# Patient Record
Sex: Female | Born: 1935 | Race: White | Hispanic: No | Marital: Married | Smoking: Never smoker
Health system: Southern US, Community
[De-identification: ages and names within clinical notes are randomized; demographics above are authoritative.]

## PROBLEM LIST (undated history)

## (undated) DIAGNOSIS — I10 Essential (primary) hypertension: Secondary | ICD-10-CM

## (undated) DIAGNOSIS — I4891 Unspecified atrial fibrillation: Secondary | ICD-10-CM

## (undated) HISTORY — PX: PACEMAKER INSERTION: SHX728

---

## 2014-03-04 DEATH — deceased

## 2017-06-17 ENCOUNTER — Emergency Department (HOSPITAL_COMMUNITY): Payer: Medicare Other

## 2017-06-17 ENCOUNTER — Encounter (HOSPITAL_COMMUNITY): Payer: Self-pay | Admitting: *Deleted

## 2017-06-17 ENCOUNTER — Emergency Department (HOSPITAL_COMMUNITY)
Admission: EM | Admit: 2017-06-17 | Discharge: 2017-06-17 | Disposition: A | Discharge status: Home / Self Care | Payer: Medicare Other | Attending: Emergency Medicine | Admitting: Emergency Medicine

## 2017-06-17 DIAGNOSIS — R112 Nausea with vomiting, unspecified: Secondary | ICD-10-CM | POA: Diagnosis present

## 2017-06-17 DIAGNOSIS — D72829 Elevated white blood cell count, unspecified: Secondary | ICD-10-CM | POA: Insufficient documentation

## 2017-06-17 DIAGNOSIS — Z950 Presence of cardiac pacemaker: Secondary | ICD-10-CM | POA: Insufficient documentation

## 2017-06-17 DIAGNOSIS — Z9049 Acquired absence of other specified parts of digestive tract: Secondary | ICD-10-CM | POA: Diagnosis not present

## 2017-06-17 DIAGNOSIS — I10 Essential (primary) hypertension: Secondary | ICD-10-CM | POA: Diagnosis not present

## 2017-06-17 DIAGNOSIS — K5730 Diverticulosis of large intestine without perforation or abscess without bleeding: Secondary | ICD-10-CM | POA: Diagnosis not present

## 2017-06-17 DIAGNOSIS — R197 Diarrhea, unspecified: Secondary | ICD-10-CM | POA: Insufficient documentation

## 2017-06-17 HISTORY — DX: Unspecified atrial fibrillation: I48.91

## 2017-06-17 HISTORY — DX: Essential (primary) hypertension: I10

## 2017-06-17 LAB — CBC
HCT: 41.5 % (ref 36.0–46.0)
HEMOGLOBIN: 13.5 g/dL (ref 12.0–15.0)
MCH: 30.2 pg (ref 26.0–34.0)
MCHC: 32.5 g/dL (ref 30.0–36.0)
MCV: 92.8 fL (ref 78.0–100.0)
Platelets: 346 10*3/uL (ref 150–400)
RBC: 4.47 MIL/uL (ref 3.87–5.11)
RDW: 13 % (ref 11.5–15.5)
WBC: 20.1 10*3/uL — AB (ref 4.0–10.5)

## 2017-06-17 LAB — URINALYSIS, ROUTINE W REFLEX MICROSCOPIC
Bacteria, UA: NONE SEEN
Bilirubin Urine: NEGATIVE
GLUCOSE, UA: NEGATIVE mg/dL
HGB URINE DIPSTICK: NEGATIVE
KETONES UR: NEGATIVE mg/dL
NITRITE: NEGATIVE
Protein, ur: NEGATIVE mg/dL
Specific Gravity, Urine: 1.018 (ref 1.005–1.030)
pH: 5 (ref 5.0–8.0)

## 2017-06-17 LAB — COMPREHENSIVE METABOLIC PANEL
ALK PHOS: 73 U/L (ref 38–126)
ALT: 19 U/L (ref 14–54)
ANION GAP: 10 (ref 5–15)
AST: 34 U/L (ref 15–41)
Albumin: 3.9 g/dL (ref 3.5–5.0)
BILIRUBIN TOTAL: 0.5 mg/dL (ref 0.3–1.2)
BUN: 20 mg/dL (ref 6–20)
CALCIUM: 9.5 mg/dL (ref 8.9–10.3)
CO2: 23 mmol/L (ref 22–32)
Chloride: 105 mmol/L (ref 101–111)
Creatinine, Ser: 1.11 mg/dL — ABNORMAL HIGH (ref 0.44–1.00)
GFR, EST AFRICAN AMERICAN: 51 mL/min — AB (ref >60)
GFR, EST NON AFRICAN AMERICAN: 44 mL/min — AB (ref >60)
Glucose, Bld: 96 mg/dL (ref 65–99)
POTASSIUM: 3.2 mmol/L — AB (ref 3.5–5.1)
Sodium: 138 mmol/L (ref 135–145)
TOTAL PROTEIN: 7.4 g/dL (ref 6.5–8.1)

## 2017-06-17 LAB — LIPASE, BLOOD: Lipase: 34 U/L (ref 11–51)

## 2017-06-17 MED ORDER — ONDANSETRON 4 MG PO TBDP: 4.0000 mg | ORAL_TABLET | Freq: Three times a day (TID) | ORAL | 0 refills | Status: AC | PRN

## 2017-06-17 MED ORDER — ONDANSETRON HCL 4 MG/2ML IJ SOLN
4.0000 mg | Freq: Once | INTRAMUSCULAR | Status: AC
  Administered 2017-06-17: 4 mg via INTRAVENOUS
  Filled 2017-06-17: qty 2

## 2017-06-17 MED ORDER — ONDANSETRON HCL 4 MG/2ML IJ SOLN: 4.0000 mg | Freq: Once | INTRAMUSCULAR | Status: DC | PRN

## 2017-06-17 MED ORDER — IOPAMIDOL (ISOVUE-300) INJECTION 61%
INTRAVENOUS | Status: AC
  Administered 2017-06-17: 75 mL via INTRAVENOUS
  Filled 2017-06-17: qty 100

## 2017-06-17 MED ORDER — SODIUM CHLORIDE 0.9 % IV BOLUS (SEPSIS)
1000.0000 mL | Freq: Once | INTRAVENOUS | Status: AC
  Administered 2017-06-17: 1000 mL via INTRAVENOUS
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
## 2017-06-17 NOTE — ED Notes (Signed)
Patient transported to CT 

## 2017-06-17 NOTE — ED Triage Notes (Signed)
Pt is visiting from out of town and woke up this morning at 0200 c/o NVD until 0300 with very watery stools. Pt remains nauseous, denies pain. Reports increased weakness with chills and tremors.

## 2017-06-17 NOTE — ED Provider Notes (Signed)
[facility]-EMERGENCY DEPT Provider Note   CSN: [id number] Arrival date & time: 06/17/17  0356     History   Chief Complaint Chief Complaint  Patient presents with  . Emesis  . Diarrhea    HPI Vanessa Cook is a 83 y.o. female.  Vanessa Cook is a 83 y.o. Female who presents to the ED complaining of nausea, vomiting and diarrhea beginning this morning. Patient reports she was up around 2 AM with nausea and then had vomiting and diarrhea at the same time. She works watery diarrhea. Multiple episodes of vomiting and diarrhea prior to arrival. She denies any abdominal pain. Previous abdominal surgical history includes a cholecystectomy.She denies any recent antibiotic use. Her granddaughter is at home with some diarrhea and sore throat. She reports while vomiting tonight she felt lightheaded and like she might pass out when she stood up. She does not feel lightheaded currently. No syncope. She has a history of paroxysmal A. Fib on Eliquis. She reports feeling much better after arrival to the ED. No treatments prior to arrival. No travel out of the state recently. She denies fevers, hematemesis, hematochezia, urinary symptoms,dysuria, hematuria, chest pain, coughing, shortness of breath, abdominal pain, or rashes.   The history is provided by the patient, medical records and a relative. No language interpreter was used.  Emesis   Associated symptoms include diarrhea. Pertinent negatives include no abdominal pain, no chills, no cough, no fever and no headaches.  Diarrhea   Associated symptoms include vomiting. Pertinent negatives include no abdominal pain, no chills, no headaches and no cough.    Past Medical History:  Diagnosis Date  . A-fib (HCC)   . Hypertension     There are no active problems to display for this patient.   Past Surgical History:  Procedure Laterality Date  . PACEMAKER INSERTION      OB History    No data available       Home Medications    Prior to Admission  medications   Not on File    Family History No family history on file.  Social History Social History  Substance Use Topics  . Smoking status: Never Smoker  . Smokeless tobacco: Never Used  . Alcohol use No     Allergies   Multaq [dronedarone]   Review of Systems Review of Systems  Constitutional: Positive for fatigue. Negative for chills and fever.  HENT: Negative for congestion and sore throat.   Eyes: Negative for visual disturbance.  Respiratory: Negative for cough and shortness of breath.   Cardiovascular: Negative for chest pain.  Gastrointestinal: Positive for diarrhea, nausea and vomiting. Negative for abdominal pain and blood in stool.  Genitourinary: Negative for difficulty urinating, dysuria, frequency, hematuria and urgency.  Musculoskeletal: Negative for back pain and neck pain.  Skin: Negative for rash.  Neurological: Positive for light-headedness (resolved ). Negative for dizziness, syncope, numbness and headaches.     Physical Exam Updated Vital Signs BP (!) 134/57   Pulse 70   Temp 97.6 F (36.4 C)   Resp 16   SpO2 99%   Physical Exam  Constitutional: She is oriented to person, place, and time. She appears well-developed and well-nourished. No distress.  Nontoxic appearing.  HENT:  Head: Normocephalic and atraumatic.  Mouth/Throat: Oropharynx is clear and moist.  Eyes: Pupils are equal, round, and reactive to light. Conjunctivae are normal. Right eye exhibits no discharge. Left eye exhibits no discharge.  Neck: Neck supple.  Cardiovascular: Normal rate, regular rhythm,  normal heart sounds and intact distal pulses.  Exam reveals no gallop and no friction rub.   No murmur heard. Normal sinus rhythm on the monitor.  Pulmonary/Chest: Effort normal and breath sounds normal. No respiratory distress. She has no wheezes. She has no rales.  Abdominal: Soft. Bowel sounds are normal. She exhibits no distension and no mass. There is no tenderness. There is  no rebound and no guarding.  Abdomen is soft and nontender to palpation.  Musculoskeletal: She exhibits no edema.  Lymphadenopathy:    She has no cervical adenopathy.  Neurological: She is alert and oriented to person, place, and time. Coordination normal.  Skin: Skin is warm and dry. No rash noted. She is not diaphoretic. No erythema. No pallor.  Psychiatric: She has a normal mood and affect. Her behavior is normal.  Nursing note and vitals reviewed.    ED Treatments / Results  Labs (all labs ordered are listed, but only abnormal results are displayed) Labs Reviewed  COMPREHENSIVE METABOLIC PANEL - Abnormal; Notable for the following:       Result Value   Potassium 3.2 (*)    Creatinine, Ser 1.11 (*)    GFR calc non Af Amer 44 (*)    GFR calc Af Amer 51 (*)    All other components within normal limits  CBC - Abnormal; Notable for the following:    WBC 20.1 (*)    All other components within normal limits  URINALYSIS, ROUTINE W REFLEX MICROSCOPIC - Abnormal; Notable for the following:    APPearance HAZY (*)    Leukocytes, UA TRACE (*)    Squamous Epithelial / LPF 0-5 (*)    All other components within normal limits  GASTROINTESTINAL PANEL BY PCR, STOOL (REPLACES STOOL CULTURE)  LIPASE, BLOOD    EKG  EKG Interpretation None       Radiology No results found.  Procedures Procedures (including critical care time)  Medications Ordered in ED Medications  sodium chloride 0.9 % bolus 1,000 mL (0 mLs Intravenous Stopped 06/17/17 0645)  ondansetron (ZOFRAN) injection 4 mg (4 mg Intravenous Given 06/17/17 0545)  iopamidol (ISOVUE-300) 61 % injection (75 mLs Intravenous Contrast Given 06/17/17 0707)     Initial Impression / Assessment and Plan / ED Course  I have reviewed the triage vital signs and the nursing notes.  Pertinent labs & imaging results that were available during my care of the patient were reviewed by me and considered in my medical decision making (see  chart for details).     This is a 83 y.o. Female who presents to the ED complaining of nausea, vomiting and diarrhea beginning this morning. Patient reports she was up around 2 AM with nausea and then had vomiting and diarrhea at the same time. She works watery diarrhea. Multiple episodes of vomiting and diarrhea prior to arrival. She denies any abdominal pain. Previous abdominal surgical history includes a cholecystectomy.She denies any recent antibiotic use. Her granddaughter is at home with some diarrhea and sore throat. She reports while vomiting tonight she felt lightheaded and like she might pass out when she stood up. She does not feel lightheaded currently. No syncope. She has a history of paroxysmal A. Fib on Eliquis. She reports feeling much better after arrival to the ED. No treatments prior to arrival. No travel out of the state recently.  On examination patient is afebrile and nontoxic appearing. Her abdomen is soft and nontender to palpation. Urinalysis without signs of infection. Lipase is within normal  limits. Creatinine is 1.11. No baseline to compare. CBC is remarkable for white count of 20,000. Based on this patient's white count and symptoms will obtain a CT abdomen and pelvis to rule out any acute intra-abdominal pathology.This is unremarkable and patient is tolerating by mouth she can likely be discharged. Patient care signed out to Sharilyn Sites, PA-C at shift change pending CT scan.  This patient was discussed with and evaluated by Dr. Judd Lien who agrees with assessment and plan.   Final Clinical Impressions(s) / ED Diagnoses   Final diagnoses:  Nausea vomiting and diarrhea  Leukocytosis, unspecified type    New Prescriptions New Prescriptions   No medications on file     Everlene Farrier, Cordelia Poche 06/17/17 0719    Geoffery Lyons, MD 06/17/17 [phone]

## 2017-06-17 NOTE — Discharge Instructions (Addendum)
Take the prescribed medication as directed.  Can use over the counter imodium if needed for continued diarrhea. Follow BRAT diet for the next few days until symptoms improve. Can follow-up with your primary care doctor once you return home. Return to the ED for new or worsening symptoms.

## 2018-08-14 IMAGING — CT CT ABD-PELV W/ CM
2 of 5 series · 16 of 46 positions shown, 18 images · IV contrast (APPLIED)
Comparison: None.

CLINICAL DATA: Patient woke this morning with nausea, vomiting and
diarrhea. Denies abdominal pain. Previous cholecystectomy.

EXAM:
CT ABDOMEN AND PELVIS WITH CONTRAST
TECHNIQUE: Multidetector CT imaging of the abdomen and pelvis was performed
using the standard protocol following bolus administration of
intravenous contrast.
CONTRAST:  < 80 mL > 6WQ8CW-ZEE IOPAMIDOL ([id number]-ZEE) INJECTION 61%

[Series 3: abd/ pelvis 5.0 i30f 2 · axial · 0.76mm/px · z∈[+829,+1209]mm · 13 of 86 slices shown, 15 images]
[im 5/86  soft-tissue]
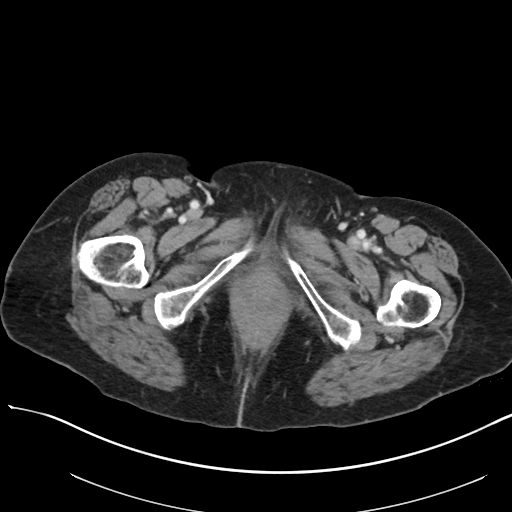
[im 5/86  bone]
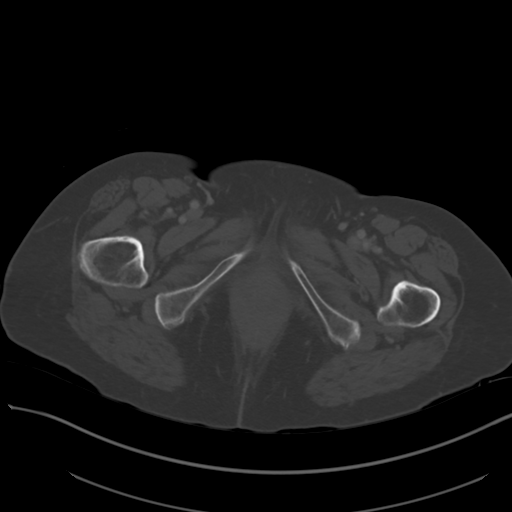
[im 10/86  soft-tissue]
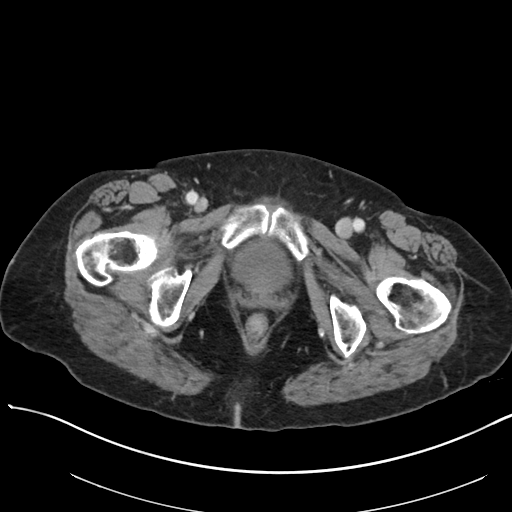
[im 19/86  soft-tissue]
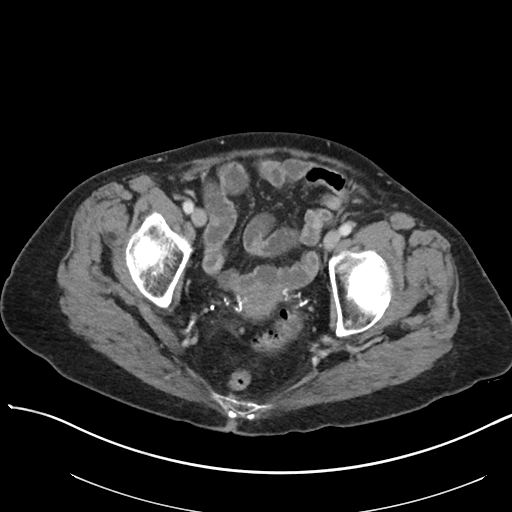
[im 24/86  soft-tissue]
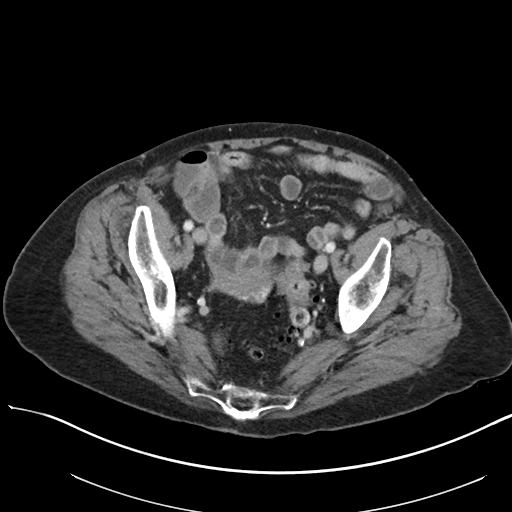
[im 29/86  soft-tissue]
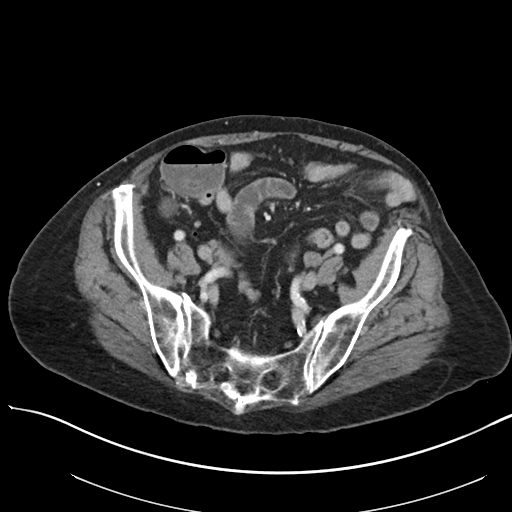
[im 38/86  soft-tissue]
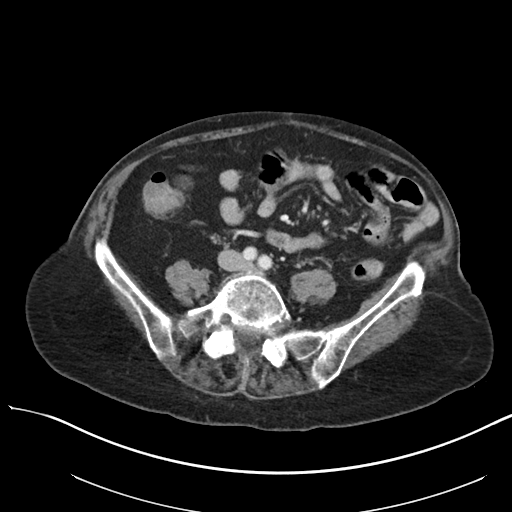
[im 43/86  soft-tissue]
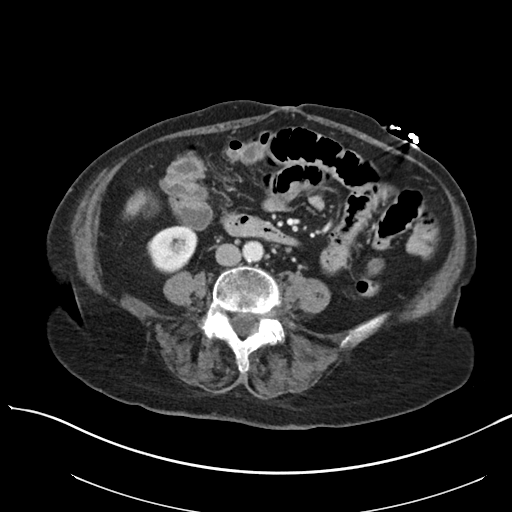
[im 48/86  soft-tissue]
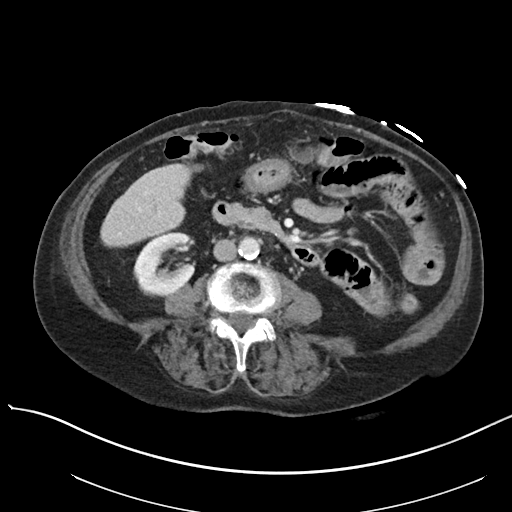
[im 57/86  soft-tissue]
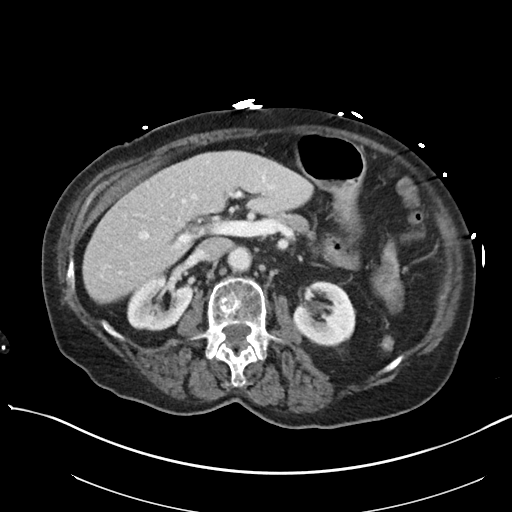
[im 57/86  bone]
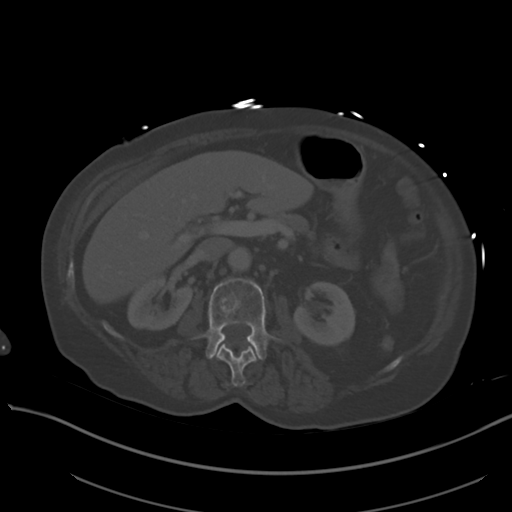
[im 62/86  soft-tissue]
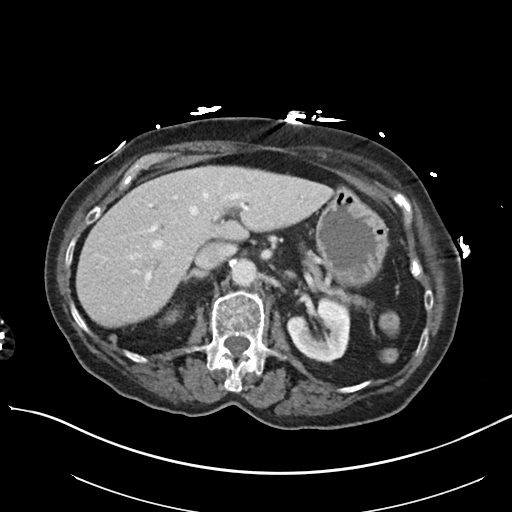
[im 67/86  soft-tissue]
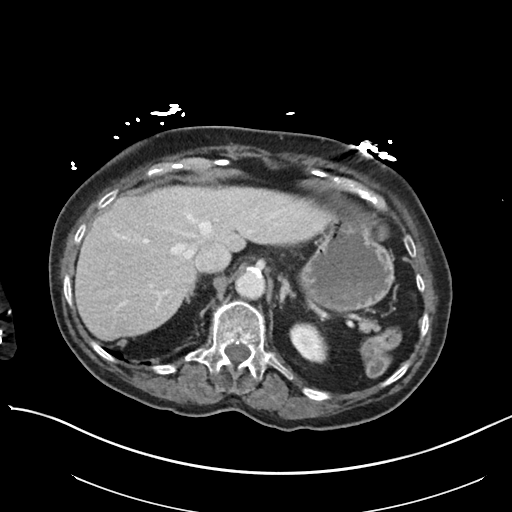
[im 76/86  soft-tissue]
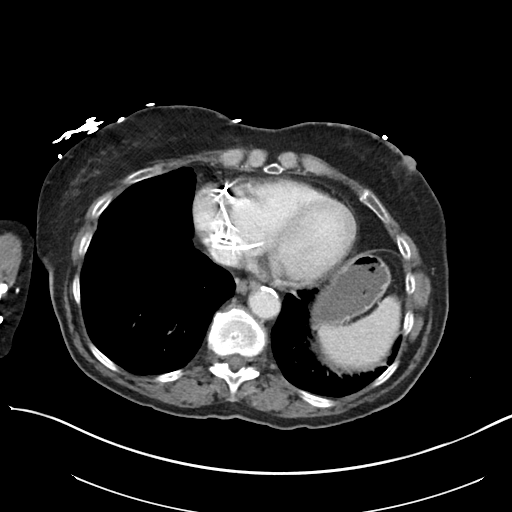
[im 81/86  soft-tissue]
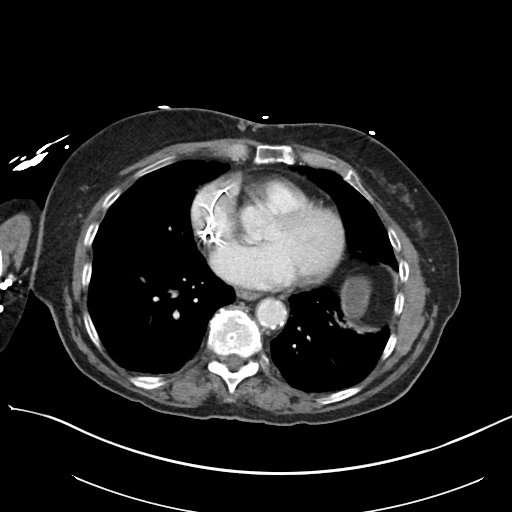

[Series 6: coronal soft tissue · coronal · 0.79mm/px · 3 of 101 slices shown]
[im 34/101  soft-tissue]
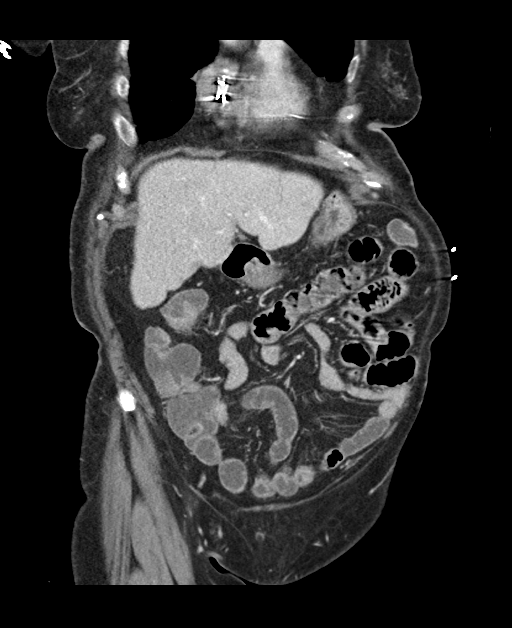
[im 45/101  soft-tissue]
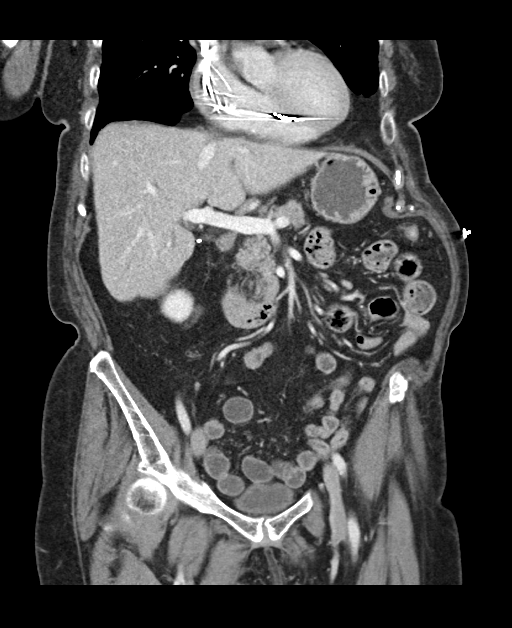
[im 56/101  soft-tissue]
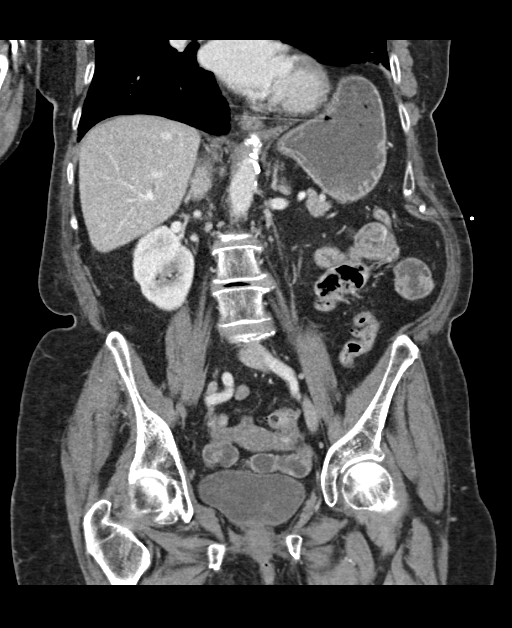

[16 of 46 positions shown; findings below may reference images not displayed]

FINDINGS: Lower chest: Minimal bibasilar atelectasis. Elevation of the left
hemidiaphragm. Cardiac pacer leads are present. Calcified plaque
over the descending thoracic aorta.

Hepatobiliary: Previous cholecystectomy. Liver and biliary tree are
within normal.

Pancreas: Within normal.

Spleen: Within normal.

Adrenals/Urinary Tract: Adrenal glands are normal and symmetric.
Kidneys are normal in size without hydronephrosis or
nephrolithiasis. Ureters and bladder are normal.

Stomach/Bowel: Stomach is within normal. There are a few mildly
prominent but nondilated small bowel loops. Appendix is normal. Mild
diverticulosis of the colon most prominent over the sigmoid colon.

Vascular/Lymphatic: Mild calcified plaque over the abdominal aorta
and iliac vessels. No significant adenopathy.

Reproductive: Within normal.

Other: No free fluid or focal inflammatory change.

Musculoskeletal: Degenerative change of the spinal multilevel disc
disease over the lumbar spine. Loss of mid vertebral body height of
L2 predominately due to Schmorl's node formation. Mild degenerate
change of the hips.
IMPRESSION: No acute findings in the abdomen/pelvis.

Mild colonic diverticulosis.

Aortic Atherosclerosis ([id number]).
